# Patient Record
Sex: Male | Born: 1960 | Race: Black or African American | Hispanic: No | Marital: Married | Smoking: Never smoker
Health system: Southern US, Community
[De-identification: ages and names within clinical notes are randomized; demographics above are authoritative.]

## PROBLEM LIST (undated history)

## (undated) DIAGNOSIS — G4730 Sleep apnea, unspecified: Secondary | ICD-10-CM

---

## 2001-12-02 ENCOUNTER — Ambulatory Visit (HOSPITAL_COMMUNITY): Admission: RE | Admit: 2001-12-02 | Discharge: 2001-12-02 | Payer: Self-pay | Admitting: Family Medicine

## 2001-12-02 ENCOUNTER — Encounter: Payer: Self-pay | Admitting: Family Medicine

## 2005-05-15 ENCOUNTER — Ambulatory Visit (HOSPITAL_COMMUNITY): Admission: RE | Admit: 2005-05-15 | Discharge: 2005-05-15 | Payer: Self-pay | Admitting: *Deleted

## 2013-07-17 ENCOUNTER — Emergency Department (HOSPITAL_COMMUNITY)
Admission: EM | Admit: 2013-07-17 | Discharge: 2013-07-17 | Disposition: A | Discharge status: Home / Self Care | Payer: Worker's Compensation | Attending: Emergency Medicine | Admitting: Emergency Medicine

## 2013-07-17 ENCOUNTER — Emergency Department (HOSPITAL_COMMUNITY): Payer: Worker's Compensation

## 2013-07-17 ENCOUNTER — Encounter (HOSPITAL_COMMUNITY): Payer: Self-pay | Admitting: Emergency Medicine

## 2013-07-17 DIAGNOSIS — Y990 Civilian activity done for income or pay: Secondary | ICD-10-CM | POA: Insufficient documentation

## 2013-07-17 DIAGNOSIS — IMO0002 Reserved for concepts with insufficient information to code with codable children: Secondary | ICD-10-CM | POA: Insufficient documentation

## 2013-07-17 DIAGNOSIS — Y9389 Activity, other specified: Secondary | ICD-10-CM | POA: Insufficient documentation

## 2013-07-17 DIAGNOSIS — S68623A Partial traumatic transphalangeal amputation of left middle finger, initial encounter: Secondary | ICD-10-CM

## 2013-07-17 DIAGNOSIS — S68621A Partial traumatic transphalangeal amputation of left index finger, initial encounter: Secondary | ICD-10-CM

## 2013-07-17 DIAGNOSIS — Y9269 Other specified industrial and construction area as the place of occurrence of the external cause: Secondary | ICD-10-CM | POA: Insufficient documentation

## 2013-07-17 DIAGNOSIS — W3189XA Contact with other specified machinery, initial encounter: Secondary | ICD-10-CM | POA: Insufficient documentation

## 2013-07-17 LAB — CBC
HCT: 44.2 % (ref 39.0–52.0)
Hemoglobin: 15.3 g/dL (ref 13.0–17.0)
MCH: 30.4 pg (ref 26.0–34.0)
MCHC: 34.6 g/dL (ref 30.0–36.0)
MCV: 87.9 fL (ref 78.0–100.0)
Platelets: 183 10*3/uL (ref 150–400)
RBC: 5.03 MIL/uL (ref 4.22–5.81)
RDW: 14.6 % (ref 11.5–15.5)
WBC: 6.8 10*3/uL (ref 4.0–10.5)

## 2013-07-17 LAB — BASIC METABOLIC PANEL
BUN: 17 mg/dL (ref 6–23)
CO2: 23 mEq/L (ref 19–32)
Calcium: 9.3 mg/dL (ref 8.4–10.5)
Chloride: 101 mEq/L (ref 96–112)
Creatinine, Ser: 0.88 mg/dL (ref 0.50–1.35)
GFR calc Af Amer: >90 mL/min (ref >90)
GFR calc non Af Amer: >90 mL/min (ref >90)
Glucose, Bld: 101 mg/dL — ABNORMAL HIGH (ref 70–99)
Potassium: 3.6 mEq/L (ref 3.5–5.1)
Sodium: 136 mEq/L (ref 135–145)

## 2013-07-17 MED ORDER — CEFAZOLIN SODIUM 1-5 GM-% IV SOLN: 1.0000 g | Freq: Once | INTRAVENOUS | Status: DC

## 2013-07-17 MED ORDER — HYDROMORPHONE HCL PF 1 MG/ML IJ SOLN
1.0000 mg | Freq: Once | INTRAMUSCULAR | Status: DC
  Filled 2013-07-17: qty 1

## 2013-07-17 MED ORDER — SODIUM CHLORIDE 0.9 % IV SOLN
Freq: Once | INTRAVENOUS | Status: AC
  Administered 2013-07-17: 08:00:00 via INTRAVENOUS

## 2013-07-17 MED ORDER — HYDROMORPHONE HCL PF 1 MG/ML IJ SOLN
1.0000 mg | Freq: Once | INTRAMUSCULAR | Status: AC
  Administered 2013-07-17: 1 mg via INTRAVENOUS
  Filled 2013-07-17: qty 1

## 2013-07-17 MED ORDER — CEFAZOLIN SODIUM 1-5 GM-% IV SOLN
INTRAVENOUS | Status: AC
  Filled 2013-07-17: qty 50

## 2013-07-17 MED ORDER — CEFAZOLIN SODIUM-DEXTROSE 2-3 GM-% IV SOLR
2.0000 g | Freq: Once | INTRAVENOUS | Status: AC
  Administered 2013-07-17: 2 g via INTRAVENOUS
  Filled 2013-07-17: qty 50

## 2013-07-17 NOTE — ED Notes (Signed)
PA at bedside for digital blocks

## 2013-07-17 NOTE — Consult Note (Signed)
Reason for Consult:complex left hand injury Referring Physician: JAIRON RIPBERGER is an 52 y.o. male.  HPI: s/p open injury to left hand with complex open segmental injuries ti index and long fingers  History reviewed. No pertinent past medical history.  History reviewed. No pertinent past surgical history.  No family history on file.  Social History:  reports that he has never smoked. He does not have any smokeless tobacco history on file. His alcohol and drug histories are not on file.  Allergies: No Known Allergies  Medications: Scheduled:  Results for orders placed during the hospital encounter of 07/17/13 (from the past 48 hour(s))  CBC     Status: None   Collection Time    07/17/13  6:40 AM      Result Value Range   WBC 6.8  4.0 - 10.5 K/uL   RBC 5.03  4.22 - 5.81 MIL/uL   Hemoglobin 15.3  13.0 - 17.0 g/dL   HCT 16.1  09.6 - 04.5 %   MCV 87.9  78.0 - 100.0 fL   MCH 30.4  26.0 - 34.0 pg   MCHC 34.6  30.0 - 36.0 g/dL   RDW 40.9  81.1 - 91.4 %   Platelets 183  150 - 400 K/uL  BASIC METABOLIC PANEL     Status: Abnormal   Collection Time    07/17/13  6:40 AM      Result Value Range   Sodium 136  135 - 145 mEq/L   Potassium 3.6  3.5 - 5.1 mEq/L   Chloride 101  96 - 112 mEq/L   CO2 23  19 - 32 mEq/L   Glucose, Bld 101 (*) 70 - 99 mg/dL   BUN 17  6 - 23 mg/dL   Creatinine, Ser 7.82  0.50 - 1.35 mg/dL   Calcium 9.3  8.4 - 95.6 mg/dL   GFR calc non Af Amer >90  >90 mL/min   GFR calc Af Amer >90  >90 mL/min   Comment: (NOTE)     The eGFR has been calculated using the CKD EPI equation.     This calculation has not been validated in all clinical situations.     eGFR's persistently <90 mL/min signify possible Chronic Kidney     Disease.    Dg Hand Complete Left  07/17/2013   CLINICAL DATA:  Roller amputation, injury.  EXAM: LEFT HAND - COMPLETE 3+ VIEW  COMPARISON:  None available for comparison at time of study interpretation.  FINDINGS: Comminuted 3rd distal  phalanx and distal aspect of the 3rd middle phalanx fractures. The bony fragment appears displaced medially. Associated soft tissue irregularity most consistent with laceration/open fracture. Tiny bony fragment at the 4th tuft, no discrete donor site. Soft tissue irregularity of the 2nd distal phalanx without fracture. No radiopaque foreign bodies. No dislocation. No destructive bony lesions.  IMPRESSION: Comminuted fracture of 3rd digit at the distal middle phalanx. A tiny bony fragment projecting at 4th tuft. Second digit is soft tissue irregularity without fracture deformity.   Electronically Signed   By: Awilda Metro   On: 07/17/2013 07:04    Review of Systems  All other systems reviewed and are negative.   Blood pressure 157/93, pulse 81, temperature 98.7 F (37.1 C), resp. rate 24, SpO2 100.00%. Physical Exam  Constitutional: He is oriented to person, place, and time. He appears well-developed and well-nourished.  HENT:  Head: Normocephalic and atraumatic.  Cardiovascular: Normal rate.   Respiratory: Effort normal.  Musculoskeletal:       Left hand: He exhibits bony tenderness, deformity and laceration.  Complex open injuries to left index and long fingers distally  Neurological: He is alert and oriented to person, place, and time.  Skin: Skin is warm.  Psychiatric: He has a normal mood and affect. His behavior is normal. Judgment and thought content normal.    Assessment/Plan: As above  Plan  revision amputation of above as outpatient  Lonnie Reth A 07/17/2013, 8:23 AM

## 2013-07-17 NOTE — ED Provider Notes (Signed)
CSN: [id number]     Arrival date & time 07/17/13  0612 History   First MD Initiated Contact with Patient 07/17/13 [phone]     Chief Complaint  Patient presents with  . Finger Injury   (Consider location/radiation/quality/duration/timing/severity/associated sxs/prior Treatment) The history is provided by the patient and medical records.   This is a 52 y.o. M with no significant PMH presenting to the ED for left hand injury.  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  States hand got caught in the chain and attempted to pull it out suffering obvious injury to his left 2nd-4th digits.  Pt states no paint got into wound.  Tetanus UTD.  Pt is right hand dominant.  Last PO intake 0530.  History reviewed. No pertinent past medical history. History reviewed. No pertinent past surgical history. No family history on file. History  Substance Use Topics  . Smoking status: Never Smoker   . Smokeless tobacco: Not on file  . Alcohol Use: Not on file    Review of Systems  Skin: Positive for wound.  All other systems reviewed and are negative.    Allergies  Review of patient's allergies indicates no known allergies.  Home Medications  No current outpatient prescriptions on file. BP 157/93  Pulse 81  Temp(Src) 98.7 F (37.1 C)  Resp 24  SpO2 100%  Physical Exam  Nursing note and vitals reviewed. Constitutional: He is oriented to person, place, and time. He appears well-developed and well-nourished. No distress.  HENT:  Head: Normocephalic and atraumatic.  Mouth/Throat: Oropharynx is clear and moist.  Eyes: Conjunctivae and EOM are normal. Pupils are equal, round, and reactive to light.  Neck: Normal range of motion. Neck supple.  Cardiovascular: Normal rate, regular rhythm and normal heart sounds.   Pulmonary/Chest: Effort normal and breath sounds normal. No respiratory distress. He has no wheezes.    Musculoskeletal: Normal range of motion.       Hands: Almost complete amputation of distal left index and middle fingers with only remaining attachment by palmar skin; small laceration to left dorsal ring finger at DIP joint; no thumb or 5th digit involvement; full sensation of left thumb, ring, and little finger; sensation up to point of amputation on index and middle finger  Neurological: He is alert and oriented to person, place, and time.  Skin: Skin is warm and dry. He is not diaphoretic.  Psychiatric: He has a normal mood and affect.      ED Course  NERVE BLOCK Date/Time: 07/17/2013 6:48 AM Performed by: Garlon Hatchet Authorized by: Garlon Hatchet Consent: Verbal consent obtained. Consent given by: patient Patient understanding: patient states understanding of the procedure being performed Patient identity confirmed: verbally with patient Indications: pain relief, extensive wound and fracture Body area: upper extremity Nerve: digital Laterality: left Patient sedated: no Preparation: Patient was prepped and draped in the usual sterile fashion. Needle gauge: 25 G Local anesthetic: lidocaine 1% without epinephrine Anesthetic total: 20 ml Outcome: pain improved Patient tolerance: Patient tolerated the procedure well with no immediate complications. Comments: Left 2nd, 3rd, and 4th digits blocked.  Pt had immediate pain relief.  Tolerated procedure well without noted complications.   (including critical care time) Labs Review Labs Reviewed - No data to display Imaging Review Dg Hand Complete Left  07/17/2013   CLINICAL DATA:  Roller amputation, injury.  EXAM: LEFT HAND - COMPLETE 3+ VIEW  COMPARISON:  None available for comparison at time of study interpretation.  FINDINGS: Comminuted 3rd distal phalanx and distal aspect of the 3rd middle phalanx fractures. The bony fragment appears displaced medially. Associated soft tissue irregularity most consistent with laceration/open  fracture. Tiny bony fragment at the 4th tuft, no discrete donor site. Soft tissue irregularity of the 2nd distal phalanx without fracture. No radiopaque foreign bodies. No dislocation. No destructive bony lesions.  IMPRESSION: Comminuted fracture of 3rd digit at the distal middle phalanx. A tiny bony fragment projecting at 4th tuft. Second digit is soft tissue irregularity without fracture deformity.   Electronically Signed   By: Awilda Metro   On: 07/17/2013 07:04    EKG Interpretation   None       MDM   1. 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 Partial traumatic transphalangeal amputation of left index finger, initial encounter    Pt with traumatic, almost complete amputation of distal left middle and index fingers.  Sensation intact up to point of amputation of involved fingers, all other fingers with full sensation.  2g Ancef given.  Tetanus UTD.  Digital block performed as above with good improvement of pain.  Consulted hand surgery, Dr. Mina Marble who has evaluated pt in the ED and scheduled for surgical repair at the surgery center later today.  Garlon Hatchet, PA-C 07/17/13 [phone]

## 2013-07-17 NOTE — ED Provider Notes (Signed)
Medical screening examination/treatment/procedure(s) were conducted as a shared visit with non-physician practitioner(s) and myself.  I personally evaluated the patient during the encounter.  EKG Interpretation   None       Patient with complex hand injury which will require operative repair.  Case discussed with orthopedic hand surgery.  Antibiotics in ER.  Patient will be taken and operated on later today at the outpatient surgical Center.  Patient's family updated.  Lyanne Co, MD 07/17/13 [phone]

## 2013-07-17 NOTE — ED Notes (Signed)
[id number] left message for wife at pt request

## 2013-07-17 NOTE — ED Notes (Addendum)
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 Index finger tip a complete amputation, middle finger amputation above first knuckle attached by small area of skin. Laceration noted to 4th finger.

## 2013-07-17 NOTE — ED Notes (Signed)
Pt at work; got left hand caught in a "track"; pulled hand into chain; presents with obvious open fracture left middle finger; glove still in place; unsure of other finger injuries

## 2013-07-17 NOTE — ED Notes (Signed)
Spoke with pts wife, she is enroute to ED

## 2017-09-06 ENCOUNTER — Other Ambulatory Visit: Payer: Self-pay | Admitting: Family Medicine

## 2017-09-06 ENCOUNTER — Other Ambulatory Visit (HOSPITAL_COMMUNITY): Payer: Self-pay

## 2017-09-06 ENCOUNTER — Ambulatory Visit
Admission: RE | Admit: 2017-09-06 | Discharge: 2017-09-06 | Disposition: A | Discharge status: Home / Self Care | Payer: 59 | Source: Ambulatory Visit | Attending: Family Medicine | Admitting: Family Medicine

## 2017-09-06 DIAGNOSIS — R52 Pain, unspecified: Secondary | ICD-10-CM

## 2017-09-24 ENCOUNTER — Other Ambulatory Visit: Payer: Self-pay | Admitting: Family Medicine

## 2017-09-24 ENCOUNTER — Ambulatory Visit
Admission: RE | Admit: 2017-09-24 | Discharge: 2017-09-24 | Disposition: A | Discharge status: Home / Self Care | Payer: 59 | Source: Ambulatory Visit | Attending: Family Medicine | Admitting: Family Medicine

## 2017-09-24 DIAGNOSIS — R918 Other nonspecific abnormal finding of lung field: Secondary | ICD-10-CM

## 2018-04-22 ENCOUNTER — Encounter: Payer: Self-pay | Admitting: Podiatry

## 2018-04-22 ENCOUNTER — Ambulatory Visit (INDEPENDENT_AMBULATORY_CARE_PROVIDER_SITE_OTHER): Payer: 59

## 2018-04-22 ENCOUNTER — Other Ambulatory Visit: Payer: Self-pay | Admitting: Podiatry

## 2018-04-22 ENCOUNTER — Ambulatory Visit: Payer: 59 | Admitting: Podiatry

## 2018-04-22 ENCOUNTER — Other Ambulatory Visit: Payer: Self-pay

## 2018-04-22 VITALS — BP 137/86 | HR 60

## 2018-04-22 DIAGNOSIS — M2042 Other hammer toe(s) (acquired), left foot: Secondary | ICD-10-CM | POA: Diagnosis not present

## 2018-04-22 DIAGNOSIS — M2041 Other hammer toe(s) (acquired), right foot: Secondary | ICD-10-CM | POA: Diagnosis not present

## 2018-04-22 DIAGNOSIS — M79675 Pain in left toe(s): Secondary | ICD-10-CM

## 2018-04-22 DIAGNOSIS — L600 Ingrowing nail: Secondary | ICD-10-CM

## 2018-04-22 NOTE — Patient Instructions (Signed)

## 2018-04-23 NOTE — Progress Notes (Signed)
Subjective:   Patient ID: Brian Bennett, male   DOB: 57 y.o.   MRN: [id number]   HPI Patient presents stating he is having a lot of pain in his toenail and is making it hard to walk and its been going on for at least 2 months.  He has to wear steel toe shoes at work which makes it worse and is trying to trim it out soak it without relief of his symptoms.  Patient does not smoke and likes to be active   Review of Systems  All other systems reviewed and are negative.       Objective:  Physical Exam  Constitutional: He appears well-developed and well-nourished.  Cardiovascular: Intact distal pulses.  Pulmonary/Chest: Effort normal.  Musculoskeletal: Normal range of motion.  Neurological: He is alert.  Skin: Skin is warm.  Nursing note and vitals reviewed.   Neurovascular status intact muscle strength is adequate range of motion within normal limits with patient noted to have a thickened incurvated left second nail medial side that is painful when pressed and making shoe gear difficult.  There is also moderate structural hammertoe deformity bilateral and this is contributory to his problems as there is a lot of pressure between the second toe and big toe.  Patient has good digital perfusion and is well oriented x3     Assessment:  Ingrown toenail deformity left second toe medial side that is painful when palpated and making shoe gear difficult with structural hammertoe deformity also noted     Plan:  H&P reviewed both conditions and at this point I recommended taking care of the toenail with the hope this will solve all his problems and he will not require any form of digital correction.  I went ahead today and I explained procedure and risk and patient signed consent form and I infiltrated the left second toe 60 mg like Marcaine mixture sterile prep applied to the toe and using sterile instrumentation I remove the medial border exposed matrix and applied phenol 3 applications 30  seconds followed by alcohol lavage and sterile dressing.  Gave instructions on soaks and reappoint and also encouraged to call with any questions concerns

## 2018-07-10 IMAGING — CR DG CHEST 2V
2 series · 2 of 2 positions shown · non-contrast
Comparison: None.

CLINICAL DATA: Cough

EXAM:
CHEST  2 VIEW

[w chest pa]
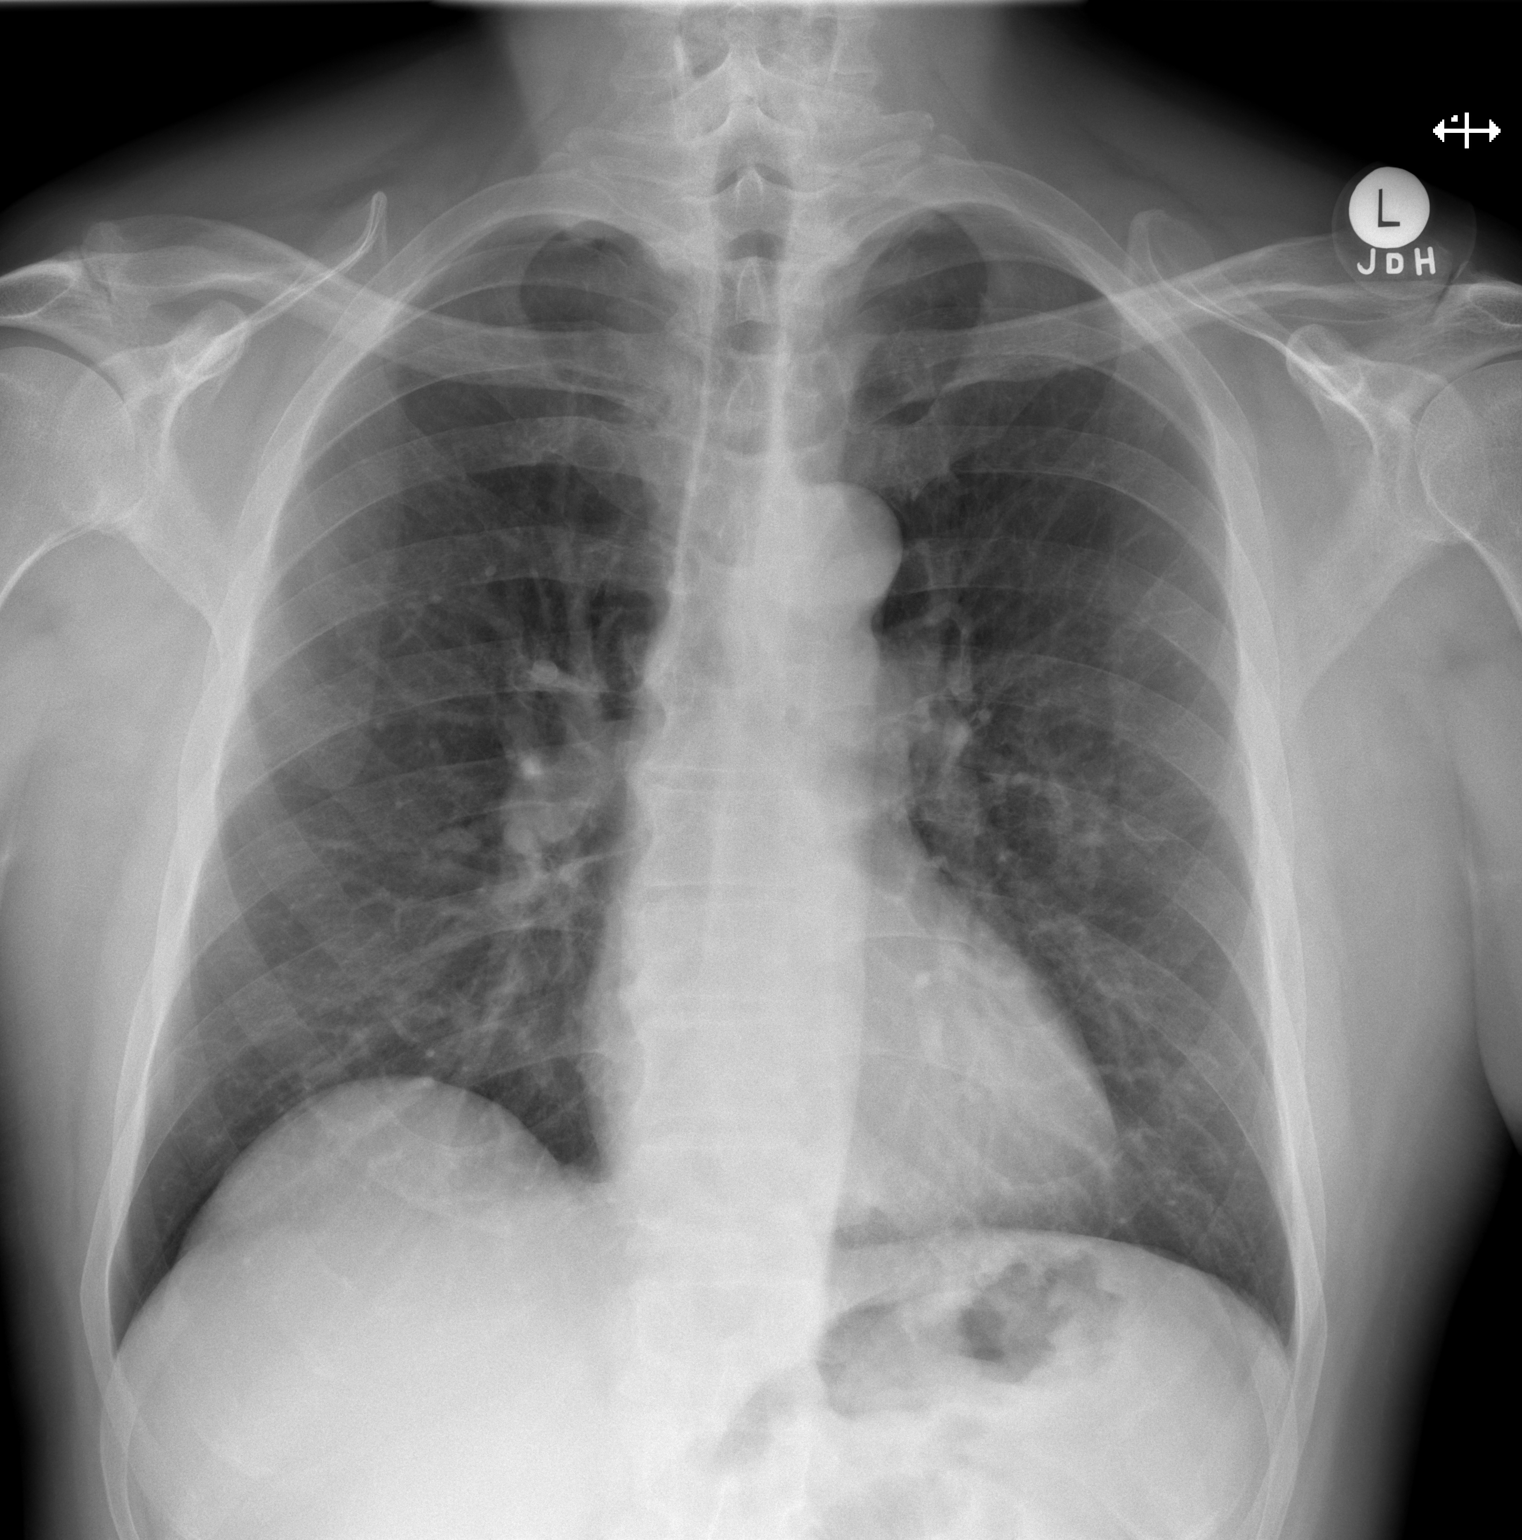

[w chest lat]
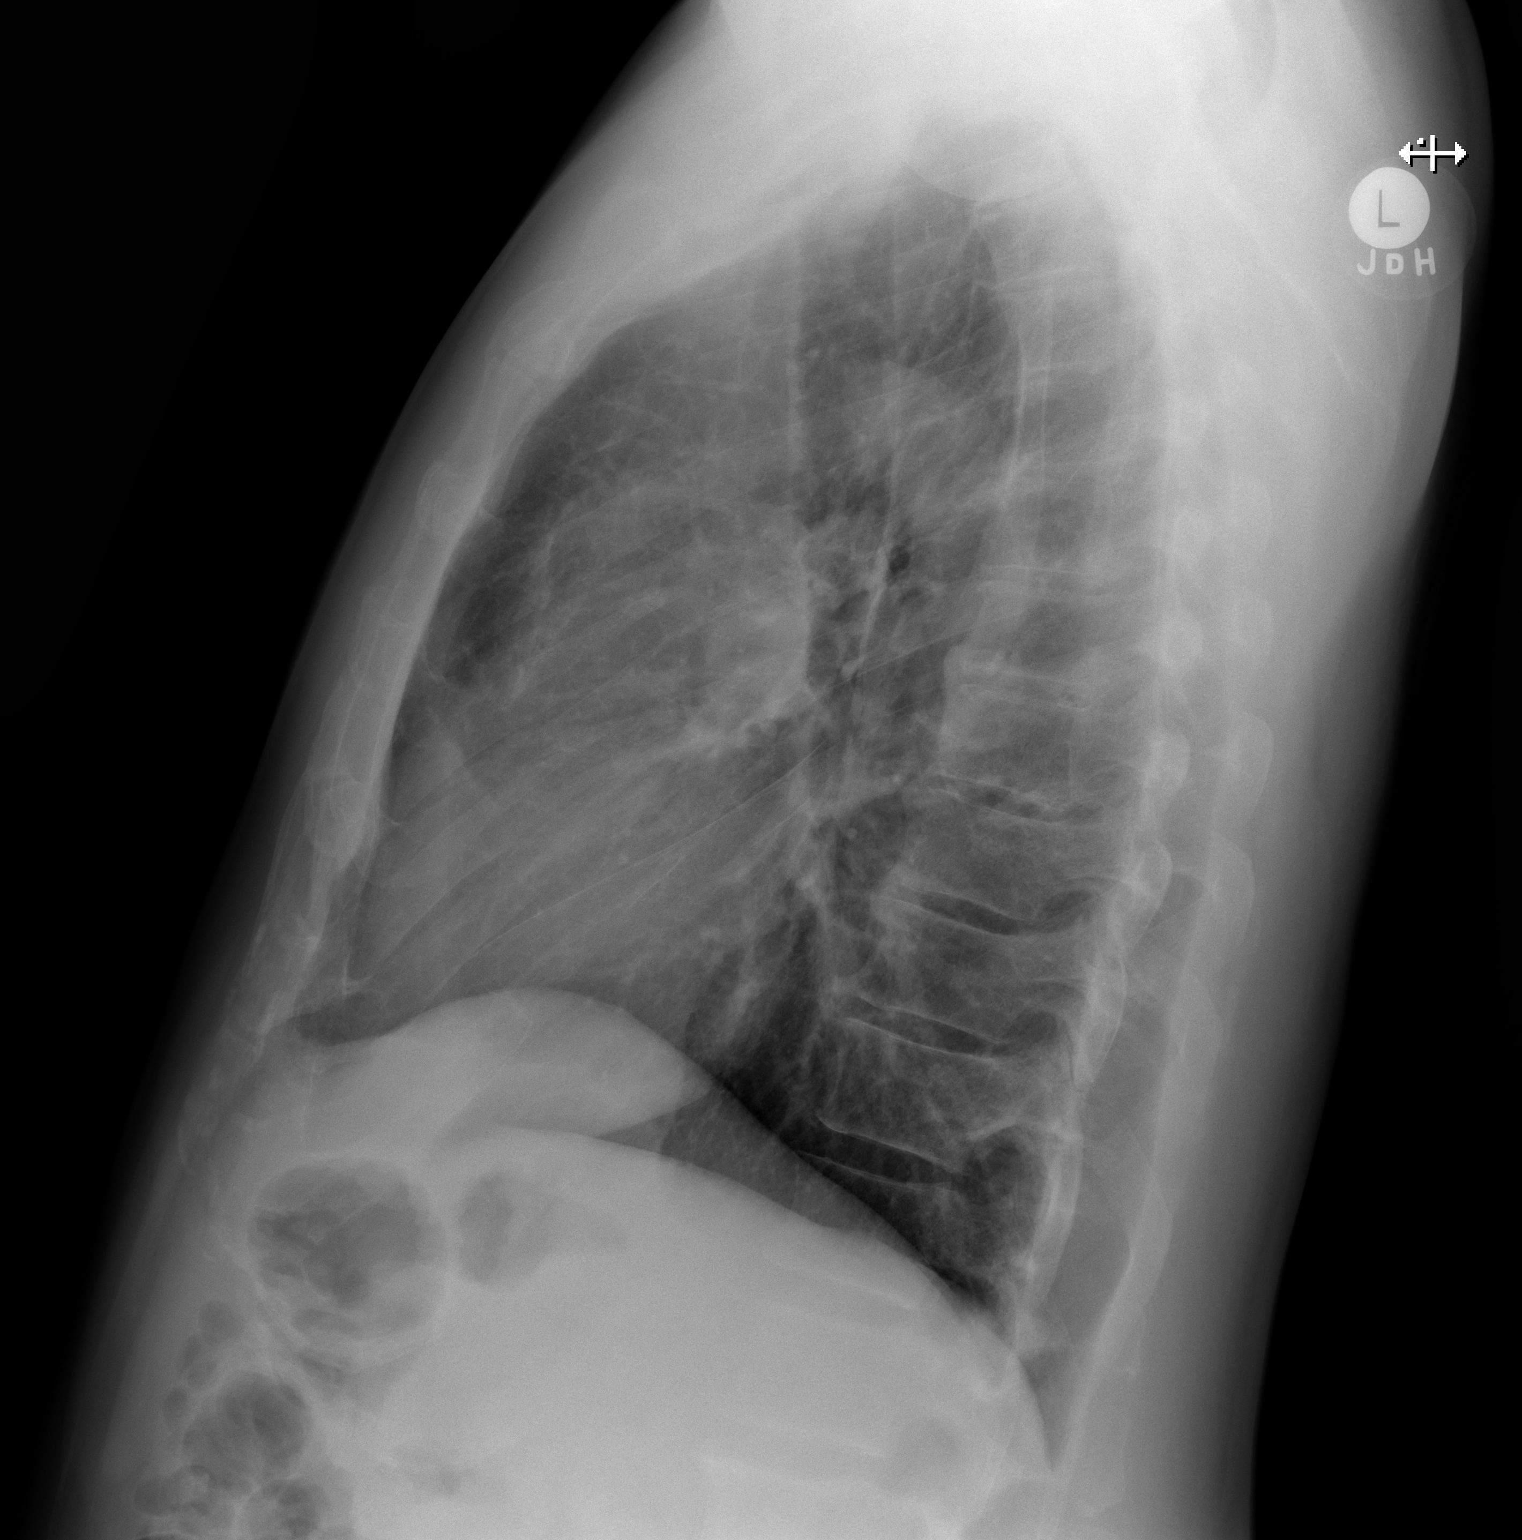

[2 of 2 positions shown; findings below may reference images not displayed]

FINDINGS: Cardiac shadow is within normal limits. The lungs are well aerated
bilaterally. No focal infiltrate or sizable effusion is seen. No
bony abnormality is noted.
IMPRESSION: No active cardiopulmonary disease.

## 2019-07-19 ENCOUNTER — Other Ambulatory Visit: Payer: Self-pay

## 2019-07-19 DIAGNOSIS — Z20822 Contact with and (suspected) exposure to covid-19: Secondary | ICD-10-CM

## 2019-07-20 LAB — NOVEL CORONAVIRUS, NAA: SARS-CoV-2, NAA: NOT DETECTED

## 2019-09-29 DIAGNOSIS — F17210 Nicotine dependence, cigarettes, uncomplicated: Secondary | ICD-10-CM | POA: Diagnosis not present

## 2019-09-29 DIAGNOSIS — I10 Essential (primary) hypertension: Secondary | ICD-10-CM | POA: Diagnosis not present

## 2019-09-29 DIAGNOSIS — R7303 Prediabetes: Secondary | ICD-10-CM | POA: Diagnosis not present

## 2020-01-18 DIAGNOSIS — R1905 Periumbilic swelling, mass or lump: Secondary | ICD-10-CM | POA: Diagnosis not present

## 2020-01-18 DIAGNOSIS — R030 Elevated blood-pressure reading, without diagnosis of hypertension: Secondary | ICD-10-CM | POA: Diagnosis not present

## 2020-01-23 ENCOUNTER — Other Ambulatory Visit: Payer: Self-pay | Admitting: Physician Assistant

## 2020-01-23 DIAGNOSIS — R1905 Periumbilic swelling, mass or lump: Secondary | ICD-10-CM

## 2020-02-01 ENCOUNTER — Ambulatory Visit
Admission: RE | Admit: 2020-02-01 | Discharge: 2020-02-01 | Disposition: A | Discharge status: Home / Self Care | Payer: 59 | Source: Ambulatory Visit | Attending: Physician Assistant | Admitting: Physician Assistant

## 2020-02-01 DIAGNOSIS — K429 Umbilical hernia without obstruction or gangrene: Secondary | ICD-10-CM | POA: Diagnosis not present

## 2020-02-01 DIAGNOSIS — R1905 Periumbilic swelling, mass or lump: Secondary | ICD-10-CM

## 2020-02-16 DIAGNOSIS — K429 Umbilical hernia without obstruction or gangrene: Secondary | ICD-10-CM | POA: Diagnosis not present

## 2020-03-29 ENCOUNTER — Ambulatory Visit: Payer: Self-pay | Admitting: Surgery

## 2020-03-29 DIAGNOSIS — K429 Umbilical hernia without obstruction or gangrene: Secondary | ICD-10-CM | POA: Diagnosis not present

## 2020-03-29 NOTE — H&P (Signed)
History of Present Illness Brian Bennett. Brian Thedford MD; 03/29/2020 9:58 AM) The patient is a 59 year old male who presents with an umbilical hernia. Referred by Brian Amble PA-C for umbilical hernia  This is a 59 year old male in good health who presents with a one-month history of a palpable mass just above his umbilicus. It remains reducible. He denies any obstructive symptoms. The PA vent evaluated him and ordered an ultrasound that confirmed a fat-containing umbilical hernia. He presents now to discuss repair.  The patient works at [employer] for [employer]. His job does require a lot of heavy lifting. He also smokes a half a pack a day.   Problem List/Past Medical Brian Bennett K. Avary Pitsenbarger, MD; 03/29/2020 9:58 AM) UMBILICAL HERNIA WITHOUT OBSTRUCTION OR GANGRENE (K42.9)  Past Surgical History Brian Bickers, LPN; 0/93/2355 7:32 AM) Foot Surgery Right.  Allergies Brian Bickers, LPN; 09/11/5425 0:62 AM) No Known Drug Allergies [03/29/2020]: Allergies Reconciled  Medication History Brian Bickers, LPN; 3/76/2831 5:17 AM) Valsartan-hydroCHLOROthiazide (320-12.5MG  Tablet, Oral) Active. Multivitamin (Oral) Active. Medications Reconciled  Social History Brian Bickers, LPN; 01/24/736 1:06 AM) Alcohol use Moderate alcohol use. Illicit drug use Remotely quit drug use. Tobacco use Current every day smoker.  Family History Brian Bickers, LPN; 2/69/4854 6:27 AM) Arthritis Family Members In General. Colon Cancer Family Members In General.  Other Problems Brian Bennett. Brian Sider, MD; 03/29/2020 9:58 AM) Inguinal Hernia     Review of Systems Brian Bennett [facility] LPN; 0/35/0093 8:18 AM) General Not Present- Appetite Loss, Chills, Fatigue, Fever, Night Sweats, Weight Gain and Weight Loss. HEENT Not Present- Earache, Hearing Loss, Hoarseness, Nose Bleed, Oral Ulcers, Ringing in the Ears, Seasonal Allergies, Sinus Pain, Sore Throat, Visual Disturbances, Wears glasses/contact lenses and Yellow  Eyes. Respiratory Present- Snoring. Not Present- Bloody sputum, Chronic Cough, Difficulty Breathing and Wheezing. Breast Not Present- Breast Mass, Breast Pain, Nipple Discharge and Skin Changes. Cardiovascular Not Present- Chest Pain, Difficulty Breathing Lying Down, Leg Cramps, Palpitations, Rapid Heart Rate, Shortness of Breath and Swelling of Extremities. Gastrointestinal Not Present- Abdominal Pain, Bloating, Bloody Stool, Change in Bowel Habits, Chronic diarrhea, Constipation, Difficulty Swallowing, Excessive gas, Gets full quickly at meals, Hemorrhoids, Indigestion, Nausea, Rectal Pain and Vomiting. Male Genitourinary Not Present- Blood in Urine, Change in Urinary Stream, Frequency, Impotence, Nocturia, Painful Urination, Urgency and Urine Leakage. Musculoskeletal Not Present- Back Pain, Joint Pain, Joint Stiffness, Muscle Pain, Muscle Weakness and Swelling of Extremities. Neurological Not Present- Decreased Memory, Fainting, Headaches, Numbness, Seizures, Tingling, Tremor, Trouble walking and Weakness. Psychiatric Not Present- Anxiety, Bipolar, Change in Sleep Pattern, Depression, Fearful and Frequent crying. Endocrine Not Present- Cold Intolerance, Excessive Hunger, Hair Changes, Heat Intolerance, Hot flashes and New Diabetes.  Vitals Brian Bennett Dockery LPN; 2/99/3716 9:67 AM) 03/29/2020 9:40 AM Weight: 222.2 lb Height: 77in Body Surface Area: 2.34 m Body Mass Index: 26.35 kg/m  Temp.: 97.66F(Thermal Scan)  Pulse: 79 (Regular)  BP: 138/86(Sitting, Left Arm, Standard)        Physical Exam Brian Bennett K. Carmello Cabiness MD; 03/29/2020 9:59 AM)  The physical exam findings are as follows: Note:Constitutional: WDWN in NAD, conversant, no obvious deformities; resting comfortably Eyes: Pupils equal, round; sclera anicteric; moist conjunctiva; no lid lag HENT: Oral mucosa moist; good dentition Neck: No masses palpated, trachea midline; no thyromegaly Lungs: CTA bilaterally; normal  respiratory effort CV: Regular rate and rhythm; no murmurs; extremities well-perfused with no edema Abd: +bowel sounds, soft, non-tender, no palpable organomegaly; small palpable reducible hernia just above and to the right of the umbilicus. No skin changes. This enlarges with  Valsalva maneuver. Musc: Normal gait; no apparent clubbing or cyanosis in extremities Lymphatic: No palpable cervical or axillary lymphadenopathy Skin: Warm, dry; no sign of jaundice Psychiatric - alert and oriented x 4; calm mood and affect    Assessment & Plan Brian Bennett K. Jasper Hanf MD; 03/29/2020 9:56 AM)  UMBILICAL HERNIA WITHOUT OBSTRUCTION OR GANGRENE (K42.9)  Current Plans Schedule for Surgery - Umbilical hernia repair with mesh. The surgical procedure has been discussed with the patient. Potential risks, benefits, alternative treatments, and expected outcomes have been explained. All of the patient's questions at this time have been answered. The likelihood of reaching the patient's treatment goal is good. The patient understand the proposed surgical procedure and wishes to proceed.  Brian Bennett. Corliss Skains, MD, [facility]  General/ Trauma Surgery   03/29/2020 9:59 AM

## 2020-04-30 DIAGNOSIS — K429 Umbilical hernia without obstruction or gangrene: Secondary | ICD-10-CM | POA: Diagnosis not present

## 2020-05-07 DIAGNOSIS — R7303 Prediabetes: Secondary | ICD-10-CM | POA: Diagnosis not present

## 2020-05-07 DIAGNOSIS — Z125 Encounter for screening for malignant neoplasm of prostate: Secondary | ICD-10-CM | POA: Diagnosis not present

## 2020-05-07 DIAGNOSIS — I10 Essential (primary) hypertension: Secondary | ICD-10-CM | POA: Diagnosis not present

## 2020-05-07 DIAGNOSIS — Z13220 Encounter for screening for lipoid disorders: Secondary | ICD-10-CM | POA: Diagnosis not present

## 2020-05-07 DIAGNOSIS — F17200 Nicotine dependence, unspecified, uncomplicated: Secondary | ICD-10-CM | POA: Diagnosis not present

## 2020-05-07 DIAGNOSIS — Z0000 Encounter for general adult medical examination without abnormal findings: Secondary | ICD-10-CM | POA: Diagnosis not present

## 2020-06-03 DIAGNOSIS — R059 Cough, unspecified: Secondary | ICD-10-CM | POA: Diagnosis not present

## 2020-06-03 DIAGNOSIS — Z20828 Contact with and (suspected) exposure to other viral communicable diseases: Secondary | ICD-10-CM | POA: Diagnosis not present

## 2020-06-04 DIAGNOSIS — H6692 Otitis media, unspecified, left ear: Secondary | ICD-10-CM | POA: Diagnosis not present

## 2020-06-04 DIAGNOSIS — B349 Viral infection, unspecified: Secondary | ICD-10-CM | POA: Diagnosis not present

## 2020-06-04 DIAGNOSIS — J029 Acute pharyngitis, unspecified: Secondary | ICD-10-CM | POA: Diagnosis not present

## 2020-06-24 ENCOUNTER — Other Ambulatory Visit: Payer: BC Managed Care – PPO

## 2020-06-24 DIAGNOSIS — Z20822 Contact with and (suspected) exposure to covid-19: Secondary | ICD-10-CM | POA: Diagnosis not present

## 2020-06-25 LAB — NOVEL CORONAVIRUS, NAA: SARS-CoV-2, NAA: DETECTED — AB

## 2020-06-25 LAB — SARS-COV-2, NAA 2 DAY TAT

## 2020-06-26 ENCOUNTER — Telehealth: Payer: Self-pay | Admitting: Nurse Practitioner

## 2020-06-26 NOTE — Telephone Encounter (Signed)
Called to Discuss with patient about Covid symptoms and the use of the monoclonal antibody infusion for those with mild to moderate Covid symptoms and at a high risk of hospitalization.     Pt appears to qualify for this infusion due to co-morbid conditions and/or a member of an at-risk group in accordance with the FDA Emergency Use Authorization. (BMI >25).   After discussing with patient he reports symptoms started >10 days prior with cold/flu-like which no has resolved. Not eligible for infusion. Does have a lingering cough.   Willette Alma, NP [facility]  [phone]

## 2020-11-06 DIAGNOSIS — F17200 Nicotine dependence, unspecified, uncomplicated: Secondary | ICD-10-CM | POA: Diagnosis not present

## 2020-11-06 DIAGNOSIS — R7303 Prediabetes: Secondary | ICD-10-CM | POA: Diagnosis not present

## 2020-11-06 DIAGNOSIS — I10 Essential (primary) hypertension: Secondary | ICD-10-CM | POA: Diagnosis not present

## 2020-11-16 IMAGING — US US ABDOMEN LIMITED
1 series · 10 of 10 positions shown · non-contrast
Comparison: CT of the abdomen and pelvis on 12/20/2009

CLINICAL DATA: Paraumbilical mass for 1 week.

EXAM:
ULTRASOUND ABDOMEN LIMITED

[Series 1: us abdomen limited · 0.06mm/px · 10 acquisitions, 10 frames shown]
[im 1/10]
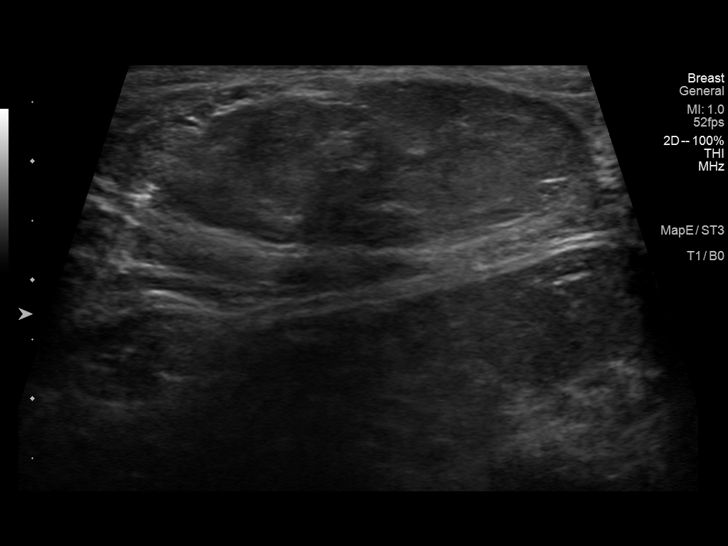
[im 2/10]
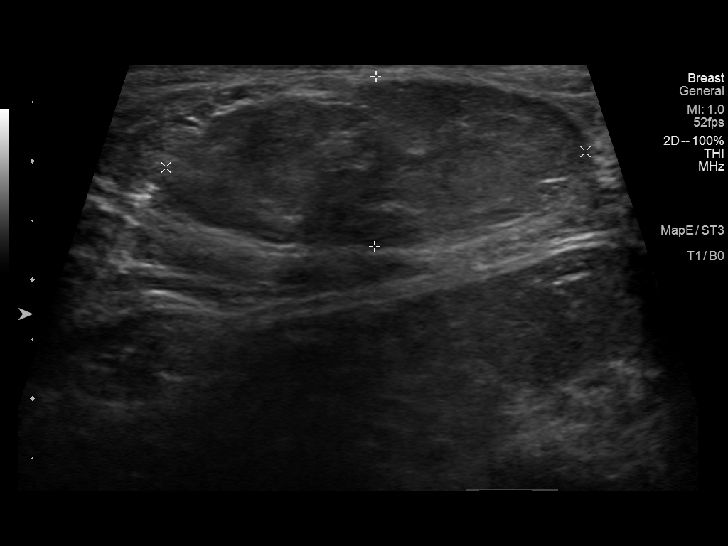
[im 3/10]
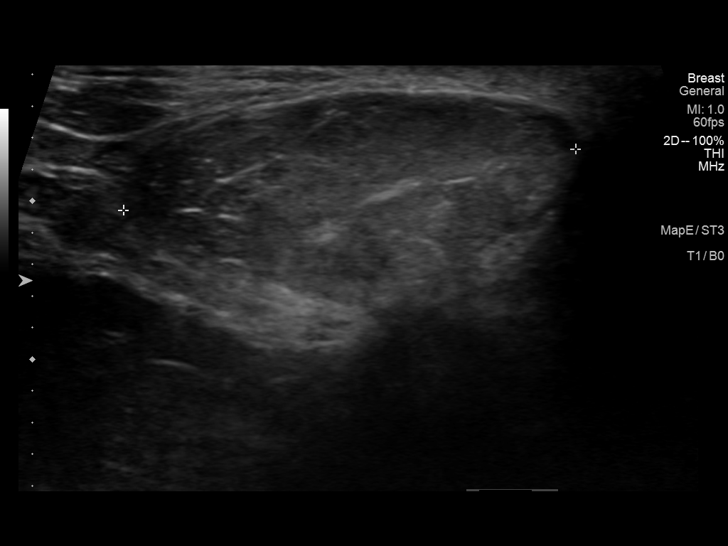
[im 4/10]
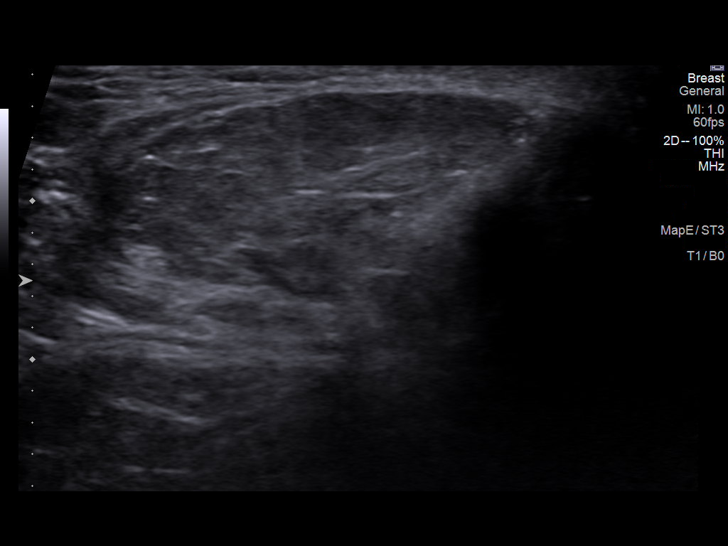
[im 5/10]
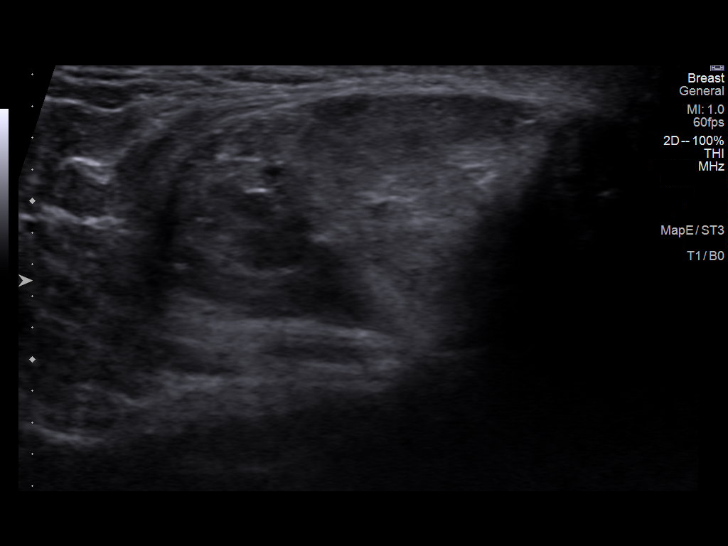
[im 6/10]
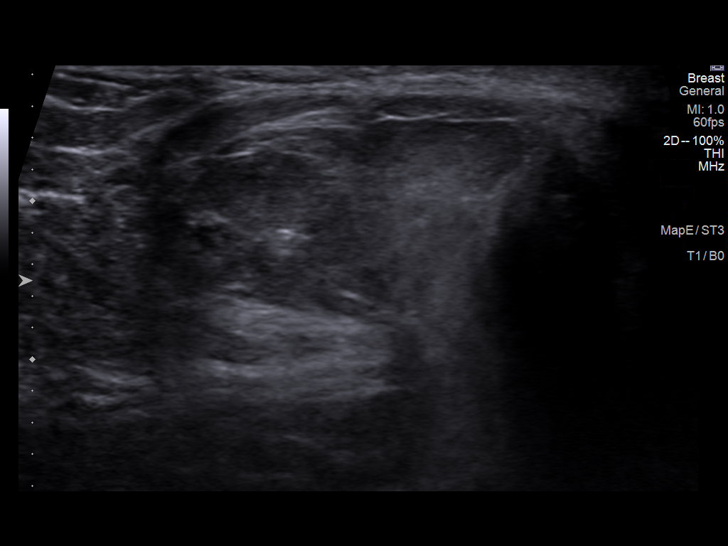
[im 7/10]
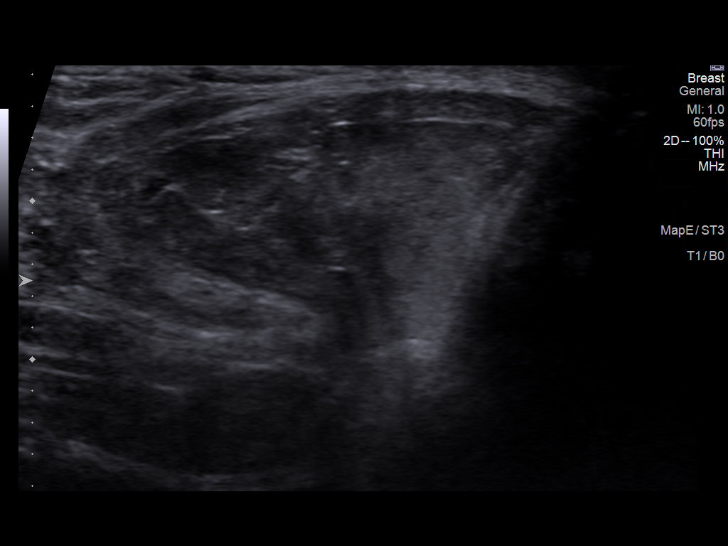
[im 8/10]
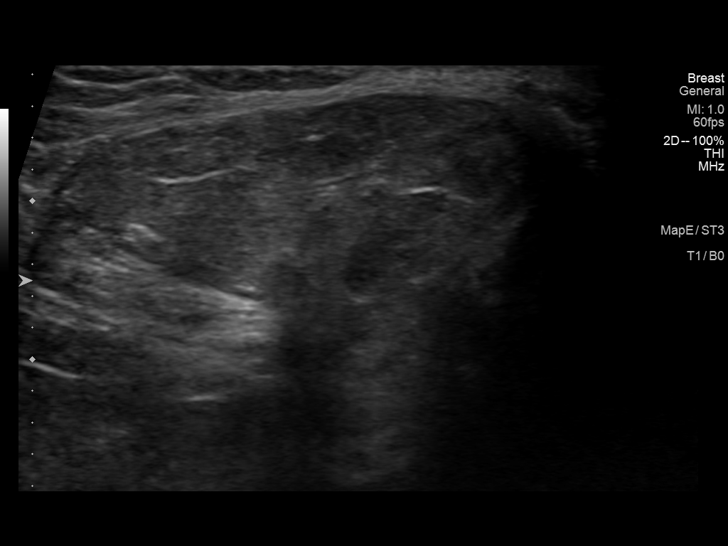
[im 9/10]
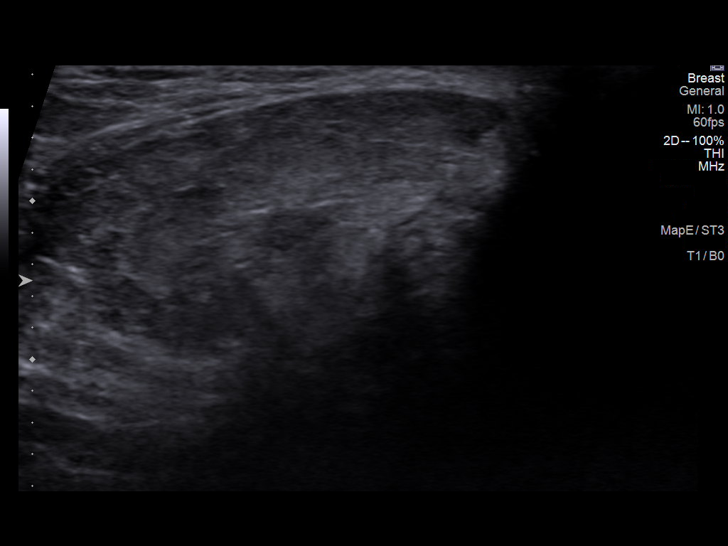
[im 10/10]
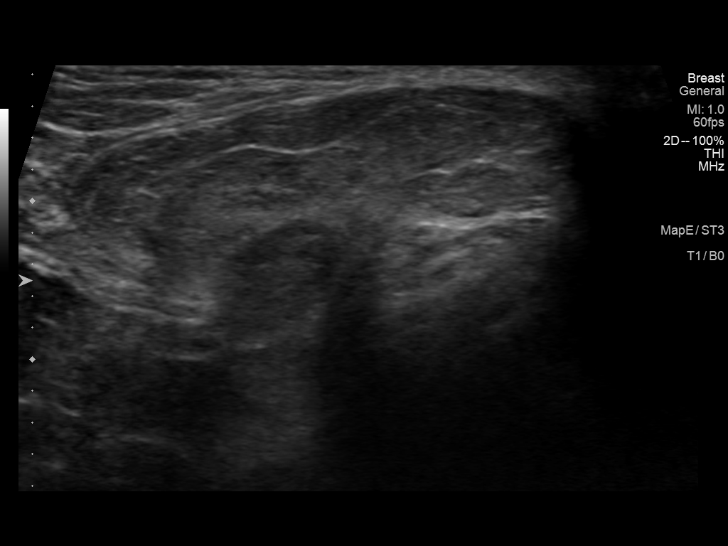

[10 of 10 positions shown; findings below may reference images not displayed]

FINDINGS: Targeted ultrasound is performed in the area of patient's concern
showing a circumscribed oval isoechoic mass measuring 1.4 x 3.5 x
2.9 centimeters. The appearance of the mass is dynamic during
imaging, consistent with herniating fat. No evidence for bowel
herniation. No fluid collection or dilated bowel loops.
IMPRESSION: Fat containing paraumbilical hernia.

## 2021-05-09 DIAGNOSIS — Z0000 Encounter for general adult medical examination without abnormal findings: Secondary | ICD-10-CM | POA: Diagnosis not present

## 2021-05-09 DIAGNOSIS — F17200 Nicotine dependence, unspecified, uncomplicated: Secondary | ICD-10-CM | POA: Diagnosis not present

## 2021-05-09 DIAGNOSIS — Z13220 Encounter for screening for lipoid disorders: Secondary | ICD-10-CM | POA: Diagnosis not present

## 2021-05-09 DIAGNOSIS — R7303 Prediabetes: Secondary | ICD-10-CM | POA: Diagnosis not present

## 2021-05-09 DIAGNOSIS — R252 Cramp and spasm: Secondary | ICD-10-CM | POA: Diagnosis not present

## 2021-05-09 DIAGNOSIS — I10 Essential (primary) hypertension: Secondary | ICD-10-CM | POA: Diagnosis not present

## 2021-11-28 DIAGNOSIS — R7303 Prediabetes: Secondary | ICD-10-CM | POA: Diagnosis not present

## 2021-11-28 DIAGNOSIS — I10 Essential (primary) hypertension: Secondary | ICD-10-CM | POA: Diagnosis not present

## 2021-11-28 DIAGNOSIS — F17200 Nicotine dependence, unspecified, uncomplicated: Secondary | ICD-10-CM | POA: Diagnosis not present

## 2022-05-22 DIAGNOSIS — Z13220 Encounter for screening for lipoid disorders: Secondary | ICD-10-CM | POA: Diagnosis not present

## 2022-05-22 DIAGNOSIS — I10 Essential (primary) hypertension: Secondary | ICD-10-CM | POA: Diagnosis not present

## 2022-05-22 DIAGNOSIS — R7303 Prediabetes: Secondary | ICD-10-CM | POA: Diagnosis not present

## 2022-05-22 DIAGNOSIS — Z0000 Encounter for general adult medical examination without abnormal findings: Secondary | ICD-10-CM | POA: Diagnosis not present

## 2022-05-22 DIAGNOSIS — F17210 Nicotine dependence, cigarettes, uncomplicated: Secondary | ICD-10-CM | POA: Diagnosis not present

## 2022-05-22 DIAGNOSIS — Z125 Encounter for screening for malignant neoplasm of prostate: Secondary | ICD-10-CM | POA: Diagnosis not present

## 2022-11-23 ENCOUNTER — Other Ambulatory Visit: Payer: Self-pay | Admitting: Physician Assistant

## 2022-11-23 DIAGNOSIS — F17200 Nicotine dependence, unspecified, uncomplicated: Secondary | ICD-10-CM

## 2022-11-23 DIAGNOSIS — R7303 Prediabetes: Secondary | ICD-10-CM | POA: Diagnosis not present

## 2022-11-23 DIAGNOSIS — F17210 Nicotine dependence, cigarettes, uncomplicated: Secondary | ICD-10-CM | POA: Diagnosis not present

## 2022-11-23 DIAGNOSIS — I10 Essential (primary) hypertension: Secondary | ICD-10-CM | POA: Diagnosis not present

## 2022-11-23 DIAGNOSIS — Z23 Encounter for immunization: Secondary | ICD-10-CM | POA: Diagnosis not present

## 2023-01-01 ENCOUNTER — Encounter: Payer: Self-pay | Admitting: Physician Assistant

## 2023-01-11 ENCOUNTER — Ambulatory Visit
Admission: RE | Admit: 2023-01-11 | Discharge: 2023-01-11 | Disposition: A | Discharge status: Home / Self Care | Payer: BC Managed Care – PPO | Source: Ambulatory Visit | Attending: Physician Assistant | Admitting: Physician Assistant

## 2023-01-11 DIAGNOSIS — F17200 Nicotine dependence, unspecified, uncomplicated: Secondary | ICD-10-CM

## 2023-02-05 ENCOUNTER — Encounter: Payer: Self-pay | Admitting: Physician Assistant

## 2023-02-09 ENCOUNTER — Other Ambulatory Visit: Payer: BC Managed Care – PPO

## 2023-02-17 ENCOUNTER — Ambulatory Visit
Admission: RE | Admit: 2023-02-17 | Discharge: 2023-02-17 | Disposition: A | Discharge status: Home / Self Care | Payer: BC Managed Care – PPO | Source: Ambulatory Visit | Attending: Physician Assistant | Admitting: Physician Assistant

## 2023-02-17 DIAGNOSIS — F17200 Nicotine dependence, unspecified, uncomplicated: Secondary | ICD-10-CM

## 2023-02-17 DIAGNOSIS — F17210 Nicotine dependence, cigarettes, uncomplicated: Secondary | ICD-10-CM | POA: Diagnosis not present

## 2023-06-03 DIAGNOSIS — Z13220 Encounter for screening for lipoid disorders: Secondary | ICD-10-CM | POA: Diagnosis not present

## 2023-06-03 DIAGNOSIS — Z5181 Encounter for therapeutic drug level monitoring: Secondary | ICD-10-CM | POA: Diagnosis not present

## 2023-06-03 DIAGNOSIS — F17200 Nicotine dependence, unspecified, uncomplicated: Secondary | ICD-10-CM | POA: Diagnosis not present

## 2023-06-03 DIAGNOSIS — I10 Essential (primary) hypertension: Secondary | ICD-10-CM | POA: Diagnosis not present

## 2023-06-03 DIAGNOSIS — Z0000 Encounter for general adult medical examination without abnormal findings: Secondary | ICD-10-CM | POA: Diagnosis not present

## 2023-06-03 DIAGNOSIS — Z125 Encounter for screening for malignant neoplasm of prostate: Secondary | ICD-10-CM | POA: Diagnosis not present

## 2023-06-21 DIAGNOSIS — K5730 Diverticulosis of large intestine without perforation or abscess without bleeding: Secondary | ICD-10-CM | POA: Diagnosis not present

## 2023-06-21 DIAGNOSIS — K635 Polyp of colon: Secondary | ICD-10-CM | POA: Diagnosis not present

## 2023-06-21 DIAGNOSIS — D128 Benign neoplasm of rectum: Secondary | ICD-10-CM | POA: Diagnosis not present

## 2023-06-21 DIAGNOSIS — Z1211 Encounter for screening for malignant neoplasm of colon: Secondary | ICD-10-CM | POA: Diagnosis not present

## 2023-06-21 DIAGNOSIS — K648 Other hemorrhoids: Secondary | ICD-10-CM | POA: Diagnosis not present

## 2023-12-29 ENCOUNTER — Other Ambulatory Visit: Payer: Self-pay | Admitting: Physician Assistant

## 2023-12-29 DIAGNOSIS — R7303 Prediabetes: Secondary | ICD-10-CM | POA: Diagnosis not present

## 2023-12-29 DIAGNOSIS — F17200 Nicotine dependence, unspecified, uncomplicated: Secondary | ICD-10-CM

## 2023-12-29 DIAGNOSIS — R0683 Snoring: Secondary | ICD-10-CM | POA: Diagnosis not present

## 2023-12-29 DIAGNOSIS — I10 Essential (primary) hypertension: Secondary | ICD-10-CM | POA: Diagnosis not present

## 2024-01-18 DIAGNOSIS — R0683 Snoring: Secondary | ICD-10-CM | POA: Diagnosis not present

## 2024-01-18 DIAGNOSIS — G478 Other sleep disorders: Secondary | ICD-10-CM | POA: Diagnosis not present

## 2024-01-18 DIAGNOSIS — M270 Developmental disorders of jaws: Secondary | ICD-10-CM | POA: Diagnosis not present

## 2024-01-18 DIAGNOSIS — R0681 Apnea, not elsewhere classified: Secondary | ICD-10-CM | POA: Diagnosis not present

## 2024-02-16 ENCOUNTER — Encounter: Payer: Self-pay | Admitting: Physician Assistant

## 2024-02-18 ENCOUNTER — Ambulatory Visit
Admission: RE | Admit: 2024-02-18 | Discharge: 2024-02-18 | Disposition: A | Discharge status: Home / Self Care | Source: Ambulatory Visit | Attending: Physician Assistant | Admitting: Physician Assistant

## 2024-02-18 DIAGNOSIS — F17200 Nicotine dependence, unspecified, uncomplicated: Secondary | ICD-10-CM

## 2024-02-18 DIAGNOSIS — F17210 Nicotine dependence, cigarettes, uncomplicated: Secondary | ICD-10-CM | POA: Diagnosis not present

## 2024-02-28 DIAGNOSIS — G4733 Obstructive sleep apnea (adult) (pediatric): Secondary | ICD-10-CM | POA: Diagnosis not present

## 2024-03-08 DIAGNOSIS — G4733 Obstructive sleep apnea (adult) (pediatric): Secondary | ICD-10-CM | POA: Diagnosis not present

## 2024-04-26 ENCOUNTER — Other Ambulatory Visit: Payer: Self-pay

## 2024-04-26 ENCOUNTER — Emergency Department (HOSPITAL_BASED_OUTPATIENT_CLINIC_OR_DEPARTMENT_OTHER)
Admission: EM | Admit: 2024-04-26 | Discharge: 2024-04-27 | Disposition: A | Discharge status: Home / Self Care | Source: Ambulatory Visit | Attending: Emergency Medicine | Admitting: Emergency Medicine

## 2024-04-26 ENCOUNTER — Encounter (HOSPITAL_BASED_OUTPATIENT_CLINIC_OR_DEPARTMENT_OTHER): Payer: Self-pay | Admitting: Emergency Medicine

## 2024-04-26 ENCOUNTER — Emergency Department (HOSPITAL_BASED_OUTPATIENT_CLINIC_OR_DEPARTMENT_OTHER): Admitting: Radiology

## 2024-04-26 DIAGNOSIS — R000 Tachycardia, unspecified: Secondary | ICD-10-CM | POA: Diagnosis not present

## 2024-04-26 DIAGNOSIS — J439 Emphysema, unspecified: Secondary | ICD-10-CM | POA: Diagnosis not present

## 2024-04-26 DIAGNOSIS — G4733 Obstructive sleep apnea (adult) (pediatric): Secondary | ICD-10-CM | POA: Diagnosis not present

## 2024-04-26 DIAGNOSIS — R0689 Other abnormalities of breathing: Secondary | ICD-10-CM | POA: Insufficient documentation

## 2024-04-26 DIAGNOSIS — I4891 Unspecified atrial fibrillation: Secondary | ICD-10-CM | POA: Diagnosis not present

## 2024-04-26 DIAGNOSIS — R5383 Other fatigue: Secondary | ICD-10-CM

## 2024-04-26 DIAGNOSIS — G4730 Sleep apnea, unspecified: Secondary | ICD-10-CM | POA: Diagnosis not present

## 2024-04-26 HISTORY — DX: Sleep apnea, unspecified: G47.30

## 2024-04-26 LAB — BASIC METABOLIC PANEL WITH GFR
Anion gap: 15 (ref 5–15)
BUN: 19 mg/dL (ref 8–23)
CO2: 23 mmol/L (ref 22–32)
Calcium: 9.9 mg/dL (ref 8.9–10.3)
Chloride: 103 mmol/L (ref 98–111)
Creatinine, Ser: 1.07 mg/dL (ref 0.61–1.24)
GFR, Estimated: >60 mL/min (ref >60)
Glucose, Bld: 140 mg/dL — ABNORMAL HIGH (ref 70–99)
Potassium: 3.8 mmol/L (ref 3.5–5.1)
Sodium: 141 mmol/L (ref 135–145)

## 2024-04-26 LAB — PRO BRAIN NATRIURETIC PEPTIDE: Pro Brain Natriuretic Peptide: 823 pg/mL — ABNORMAL HIGH (ref <300.0)

## 2024-04-26 LAB — TSH: TSH: 1.41 u[IU]/mL (ref 0.350–4.500)

## 2024-04-26 LAB — CBC
HCT: 47.6 % (ref 39.0–52.0)
Hemoglobin: 16.3 g/dL (ref 13.0–17.0)
MCH: 30.6 pg (ref 26.0–34.0)
MCHC: 34.2 g/dL (ref 30.0–36.0)
MCV: 89.3 fL (ref 80.0–100.0)
Platelets: 248 K/uL (ref 150–400)
RBC: 5.33 MIL/uL (ref 4.22–5.81)
RDW: 14 % (ref 11.5–15.5)
WBC: 7.9 K/uL (ref 4.0–10.5)
nRBC: 0 % (ref 0.0–0.2)

## 2024-04-26 LAB — MAGNESIUM: Magnesium: 2.1 mg/dL (ref 1.7–2.4)

## 2024-04-26 LAB — TROPONIN T, HIGH SENSITIVITY
Troponin T High Sensitivity: <15 ng/L (ref 0–19)
Troponin T High Sensitivity: <15 ng/L (ref 0–19)

## 2024-04-26 MED ORDER — DIGOXIN 125 MCG PO TABS
0.2500 mg | ORAL_TABLET | Freq: Once | ORAL | Status: AC
Start: 2024-04-26 — End: 2024-04-26
  Administered 2024-04-26: 0.25 mg via ORAL
  Filled 2024-04-26: qty 2

## 2024-04-26 NOTE — ED Provider Notes (Signed)
 [place] [facility]PARTMENT AT [facility] Provider Note   CSN: [id number] Arrival date & time: 04/26/24  1911     Patient presents with: Tachycardia   Brian Bennett is a 63 y.o. male.  {Add pertinent medical, surgical, social history, OB history to HPI:32947} HPI     Prior to Admission medications   Medication Sig Start Date End Date Taking? Authorizing Provider  valsartan -hydrochlorothiazide  (DIOVAN -HCT) 160-12.5 MG tablet Take 1 tablet by mouth daily. 03/09/18   [provider]    Allergies: Patient has no known allergies.    Review of Systems  Updated Vital Signs BP 127/76   Pulse (!) 118   Temp 99 F (37.2 C) (Oral)   Resp 17   SpO2 97%   Physical Exam  (all labs ordered are listed, but only abnormal results are displayed) Labs Reviewed  BASIC METABOLIC PANEL WITH GFR - Abnormal; Notable for the following components:      Result Value   Glucose, Bld 140 (*)    All other components within normal limits  CBC  TROPONIN T, HIGH SENSITIVITY  TROPONIN T, HIGH SENSITIVITY    EKG: EKG Interpretation Date/Time:  Wednesday April 26 2024 19:23:49 EDT Ventricular Rate:  107 PR Interval:    QRS Duration:  88 QT Interval:  330 QTC Calculation: 440 R Axis:   54  Text Interpretation: Atrial fibrillation with rapid ventricular response ST & T wave abnormality, consider inferior ischemia Abnormal ECG No previous ECGs available no prior ECG for comparison No sTEMI Confirmed by Ginger Barefoot ([id number]) on 04/26/2024 10:21:39 PM  Radiology: DG Chest 2 View Result Date: 04/26/2024 CLINICAL DATA:  Tachycardia, sleep apnea.  No other complaints. EXAM: CHEST - 2 VIEW COMPARISON:  PA Lat chest 09/24/2017. Chest CT without contrast 02/18/2024. FINDINGS: The heart size and mediastinal contours are within normal limits. Both lungs are slightly emphysematous but clear apart from calcified pleural plaquing anterior left mid chest. The visualized skeletal  structures are intact, with multilevel thoracic spine bridging enthesopathy. IMPRESSION: 1. No evidence of acute chest disease. 2. Calcified pleural plaquing anterior left mid chest. 3. Emphysema. Electronically Signed   By: Francis Quam M.D.   On: 04/26/2024 20:15    {Document cardiac monitor, telemetry assessment procedure when appropriate:32947} Procedures   Medications Ordered in the ED - No data to display    {Click here for ABCD2, HEART and other calculators REFRESH Note before signing:1}                              Medical Decision Making Amount and/or Complexity of Data Reviewed Labs: ordered. Radiology: ordered.    Brian Bennett is a 63 y.o. male with no significant past medical history who presents from a sleep evaluation for tachycardia fatigue and suspected arrhythmia.  According to patient, he has snored for his whole life and was getting evaluated for possible sleep apnea and was told he had tachycardia and needs to come in for evaluation.  Patient says that for quite some time he has had fatigue but otherwise he denies any chest pain shortness of breath or palpitations.  He does have cramps in his legs frequently per family but he denies any chest pain.  His heart rate was found to be into the 130s and 140s and appeared to show A-fib with RVR.  He has no personal or family history of arrhythmia or A-fib that he knows of.  He  denies any fevers, chills, congestion, or cough.  He is otherwise asymptomatic aside from some mild fatigue.  He reports no leg pain or leg swelling but occasionally has cramping.  He denies any recent medication use or drug use.  He denies any stimulant or significant caffeine use.  Denies any history of thyroid disease.  Denies other complaints but was told he has to come in for this arrhythmia.  On exam, lungs clear.  Chest nontender.  Abdomen nontender.  No murmur.  No focal neurologic deficits.  No carotid bruit.  Patient resting and otherwise  well-appearing.  No leg tenderness or leg swelling.  Good pulses in extremities.  EKG on arrival does show A-fib with RVR with heart rates going into the 130s.  On reassessment heart rate is now between 100 and 120 but still shows atrial fibrillation.  He still has no palpitations but just feels tired.  Clinically I suspect he has been in A-fib for quite some time and this may have been causing some of his fatigue.  We will get screening labs to look for electrolyte disturbance given his leg cramping.  Will call cardiology given this likely new A-fib without a clear onset but with some symptoms such as fatigue.  Anticipate follow-up after the lab evaluation and discussion with cardiology as anticipate he made admission for echo and further A-fib workup and management.  11:14 PM Blood pressure is now in the 90s and heart rate is still about 120.  I initially spoke to a cardiologist to inform me that a different cardiology team member should be the one called so we will try to call the other cardiologist but she felt that given the soft pressures and still fast heart rate, he would benefit from medicine admission with cardiology consulting.  Will call the other cardiologist to discuss admission.   {Document critical care time when appropriate  Document review of labs and clinical decision tools ie CHADS2VASC2, etc  Document your independent review of radiology images and any outside records  Document your discussion with family members, caretakers and with consultants  Document social determinants of health affecting pt's care  Document your decision making why or why not admission, treatments were needed:32947:::1}   Final diagnoses:  None    ED Discharge Orders     None

## 2024-04-26 NOTE — ED Notes (Signed)
 Pt here today after visit with pulmonary for his sleep apnea New onset Atrial fib.  NAD or pain

## 2024-04-26 NOTE — ED Triage Notes (Signed)
 Seen in pulm office for sleep apnea eval. Was told to go to ed for tachycardia. Denies c/o

## 2024-04-27 LAB — CBC
HCT: 47.7 % (ref 39.0–52.0)
Hemoglobin: 16 g/dL (ref 13.0–17.0)
MCH: 30.6 pg (ref 26.0–34.0)
MCHC: 33.5 g/dL (ref 30.0–36.0)
MCV: 91.2 fL (ref 80.0–100.0)
Platelets: 225 K/uL (ref 150–400)
RBC: 5.23 MIL/uL (ref 4.22–5.81)
RDW: 14 % (ref 11.5–15.5)
WBC: 6.7 K/uL (ref 4.0–10.5)
nRBC: 0 % (ref 0.0–0.2)

## 2024-04-27 LAB — HEPARIN LEVEL (UNFRACTIONATED): Heparin Unfractionated: 0.52 [IU]/mL (ref 0.30–0.70)

## 2024-04-27 MED ORDER — METOPROLOL TARTRATE 5 MG/5ML IV SOLN
5.0000 mg | Freq: Once | INTRAVENOUS | Status: AC
Start: 2024-04-27 — End: 2024-04-27
  Administered 2024-04-27: 5 mg via INTRAVENOUS
  Filled 2024-04-27: qty 5

## 2024-04-27 MED ORDER — APIXABAN 2.5 MG PO TABS
5.0000 mg | ORAL_TABLET | Freq: Two times a day (BID) | ORAL | Status: DC
  Administered 2024-04-27: 5 mg via ORAL
  Filled 2024-04-27: qty 2

## 2024-04-27 MED ORDER — VALSARTAN-HYDROCHLOROTHIAZIDE 320-12.5 MG PO TABS: 1.0000 | ORAL_TABLET | Freq: Every day | ORAL | Status: DC

## 2024-04-27 MED ORDER — METOPROLOL TARTRATE 25 MG PO TABS
25.0000 mg | ORAL_TABLET | Freq: Two times a day (BID) | ORAL | Status: DC
Start: 2024-04-27 — End: 2024-04-27
  Administered 2024-04-27: 25 mg via ORAL
  Filled 2024-04-27: qty 1

## 2024-04-27 MED ORDER — DILTIAZEM HCL ER COATED BEADS 120 MG PO CP24
120.0000 mg | ORAL_CAPSULE | Freq: Once | ORAL | Status: AC
  Administered 2024-04-27: 120 mg via ORAL
  Filled 2024-04-27: qty 1

## 2024-04-27 MED ORDER — DILTIAZEM HCL ER COATED BEADS 180 MG PO CP24
180.0000 mg | ORAL_CAPSULE | Freq: Every day | ORAL | 6 refills | Status: AC
Start: 2024-04-27 — End: 2025-04-27

## 2024-04-27 MED ORDER — HEPARIN (PORCINE) 25000 UT/250ML-% IV SOLN
1600.0000 [IU]/h | INTRAVENOUS | Status: DC
Start: 2024-04-27 — End: 2024-04-27
  Administered 2024-04-27: 1600 [IU]/h via INTRAVENOUS
  Filled 2024-04-27: qty 250

## 2024-04-27 MED ORDER — DIGOXIN 125 MCG PO TABS
0.2500 mg | ORAL_TABLET | Freq: Once | ORAL | Status: AC
  Administered 2024-04-27: 0.25 mg via ORAL
  Filled 2024-04-27: qty 2

## 2024-04-27 MED ORDER — APIXABAN 5 MG PO TABS: 5.0000 mg | ORAL_TABLET | Freq: Two times a day (BID) | ORAL | 6 refills | Status: AC

## 2024-04-27 MED ORDER — HYDROCHLOROTHIAZIDE 25 MG PO TABS
12.5000 mg | ORAL_TABLET | Freq: Every day | ORAL | Status: DC
  Administered 2024-04-27: 12.5 mg via ORAL
  Filled 2024-04-27: qty 1

## 2024-04-27 MED ORDER — HEPARIN BOLUS VIA INFUSION
4000.0000 [IU] | Freq: Once | INTRAVENOUS | Status: AC
  Administered 2024-04-27: 4000 [IU] via INTRAVENOUS

## 2024-04-27 MED ORDER — METOPROLOL TARTRATE 25 MG PO TABS: 25.0000 mg | ORAL_TABLET | Freq: Two times a day (BID) | ORAL | 3 refills | Status: AC

## 2024-04-27 MED ORDER — IRBESARTAN 150 MG PO TABS
300.0000 mg | ORAL_TABLET | Freq: Every day | ORAL | Status: DC
  Administered 2024-04-27: 300 mg via ORAL
  Filled 2024-04-27: qty 2

## 2024-04-27 NOTE — Discharge Instructions (Signed)

## 2024-04-27 NOTE — ED Notes (Signed)
 New IV placed when obtaining CBC and Heparin  value. Patient states he feels well this morning. Patient and family updated that no inpatient bed is assigned to the patient currently. Patient provided with ice water per patient request. Call bell within reach. Patient states no other needs at this time.

## 2024-04-27 NOTE — H&P (Signed)
 Referring Physician: Lonni PARAS Tegeler, MD  Brian Bennett is an 63 y.o. male.                       Chief Complaint: Tachycardia/A. Fib.  HPI: 63 years old male with PMH of HTN, obesity has atrial fibrillation found on visit to pulmonary doctor for sleep apnea evaluation and was sent to ED for further evaluation where he was in a. Fib. with RVR. Patient denies chest pain but has some fatigue. CXR positive for pleural plaque and emphysema.   Past Medical History:  Diagnosis Date   Sleep apnea       History reviewed. No pertinent surgical history.  History reviewed. No pertinent family history. Social History:  reports that he has never smoked. He has never used smokeless tobacco. No history on file for alcohol use and drug use.  Allergies: No Known Allergies  (Not in a hospital admission)   Results for orders placed or performed during the hospital encounter of 04/26/24 (from the past 48 hours)  Basic metabolic panel     Status: Abnormal   Collection Time: 04/26/24  7:24 PM  Result Value Ref Range   Sodium 141 135 - 145 mmol/L   Potassium 3.8 3.5 - 5.1 mmol/L   Chloride 103 98 - 111 mmol/L   CO2 23 22 - 32 mmol/L   Glucose, Bld 140 (H) 70 - 99 mg/dL    Comment: Glucose reference range applies only to samples taken after fasting for at least 8 hours.   BUN 19 8 - 23 mg/dL   Creatinine, Ser 8.92 0.61 - 1.24 mg/dL   Calcium 9.9 8.9 - 89.6 mg/dL   GFR, Estimated >39 >39 mL/min    Comment: (NOTE) Calculated using the CKD-EPI Creatinine Equation (2021)    Anion gap 15 5 - 15    Comment: Performed at [facility], [address], [place], [place] [address]  CBC     Status: None   Collection Time: 04/26/24  7:24 PM  Result Value Ref Range   WBC 7.9 4.0 - 10.5 K/uL   RBC 5.33 4.22 - 5.81 MIL/uL   Hemoglobin 16.3 13.0 - 17.0 g/dL   HCT 52.3 60.9 - 47.9 %   MCV 89.3 80.0 - 100.0 fL   MCH 30.6 26.0 - 34.0 pg   MCHC 34.2 30.0 - 36.0 g/dL   RDW 85.9  88.4 - 84.4 %   Platelets 248 150 - 400 K/uL   nRBC 0.0 0.0 - 0.2 %    Comment: Performed at [facility], [address], [place], [place] [address]  Troponin T, High Sensitivity     Status: None   Collection Time: 04/26/24  7:24 PM  Result Value Ref Range   Troponin T High Sensitivity <15 0 - 19 ng/L    Comment: (NOTE) Biotin concentrations > 1000 ng/mL falsely decrease TnT results.  Serial cardiac troponin measurements are suggested.  Refer to the Links section for chest pain algorithms and additional  guidance. Performed at [facility], [address] [place], [place] [address]   Magnesium     Status: None   Collection Time: 04/26/24  7:24 PM  Result Value Ref Range   Magnesium 2.1 1.7 - 2.4 mg/dL    Comment: Performed at [facility], [address], [place], [place] [address]  Pro Brain natriuretic peptide     Status: Abnormal   Collection Time: 04/26/24  7:24 PM  Result Value Ref Range   Pro Brain Natriuretic Peptide 823.0 (H) <300.0 pg/mL    Comment: (NOTE) Age Group        Cut-Points    Interpretation  < 50 years     450 pg/mL       NT-proBNP > 450 pg/mL indicates                                ADHF is likely              50 to 75 years  900 pg/mL      NT-proBNP > 900 pg/mL indicates          ADHF is likely  > 75 years      1800 pg/mL     NT-proBNP > 1800 pg/mL indicates          ADHF is likely                           All ages    Results between       Indeterminate. Further clinical             300 and the cut-   information is needed to determine            point for age group   if ADHF is present.                                                             Elecsys proBNP II/ Elecsys proBNP II STAT           Cut-Point                       Interpretation  300 pg/mL                    NT-proBNP <300pg/mL indicates                             ADHF is not likely  Performed at [facility], [address], [place], [place] [address]   TSH     Status: None   Collection Time: 04/26/24  7:29 PM  Result Value Ref Range   TSH 1.410 0.350 - 4.500 uIU/mL    Comment: Performed at [facility], [address], [place], [place] [address]  Troponin T, High Sensitivity     Status: None   Collection Time: 04/26/24 11:24 PM  Result Value Ref Range   Troponin T High Sensitivity <15 0 - 19 ng/L    Comment: (NOTE) Biotin concentrations > 1000 ng/mL falsely decrease TnT results.  Serial cardiac troponin measurements are suggested.  Refer to the Links section for chest pain algorithms and additional  guidance. Performed at [facility], [address], [place], [place] [address]   CBC     Status: None   Collection Time: 04/27/24  8:18 AM  Result Value Ref Range   WBC 6.7 4.0 - 10.5 K/uL   RBC 5.23 4.22 - 5.81 MIL/uL   Hemoglobin 16.0 13.0 - 17.0 g/dL   HCT 52.2 60.9 - 47.9 %  MCV 91.2 80.0 - 100.0 fL   MCH 30.6 26.0 - 34.0 pg   MCHC 33.5 30.0 - 36.0 g/dL   RDW 85.9 88.4 - 84.4 %   Platelets 225 150 - 400 K/uL   nRBC 0.0 0.0 - 0.2 %    Comment: Performed at [facility], [address] [place], [place] [address]   DG Chest 2 View Result Date: 04/26/2024 CLINICAL DATA:  Tachycardia, sleep apnea.  No other complaints. EXAM: CHEST - 2 VIEW COMPARISON:  PA Lat chest 09/24/2017. Chest CT without contrast 02/18/2024. FINDINGS: The heart size and mediastinal contours are within normal limits. Both lungs are slightly emphysematous but clear apart from calcified pleural plaquing anterior left mid chest. The visualized skeletal structures are intact, with multilevel thoracic spine bridging enthesopathy. IMPRESSION: 1. No evidence of acute chest disease. 2. Calcified pleural plaquing anterior left mid chest. 3. Emphysema. Electronically Signed   By: Francis Quam M.D.   On: 04/26/2024 20:15    Review Of  Systems Constitutional: No fever, chills, weight loss or gain. Eyes: No vision change, wears glasses. No discharge or pain. Ears: No hearing loss, No tinnitus. Respiratory: No asthma, Positive emphysema, pneumonias. No shortness of breath. No hemoptysis. Cardiovascular: No chest pain, palpitation, leg edema. Gastrointestinal: No nausea, vomiting, diarrhea, constipation. No GI bleed. No hepatitis. Genitourinary: No dysuria, hematuria, kidney stone. No incontinance. Neurological: No headache, stroke, seizures.  Psychiatry: No psych facility admission for anxiety, depression, suicide. No detox. Skin: No rash. Musculoskeletal: No joint pain, fibromyalgia. No neck pain, back pain. Lymphadenopathy: No lymphadenopathy. Hematology: No anemia or easy bruising.   Blood pressure (!) 125/92, pulse (!) 114, temperature 98.7 F (37.1 C), temperature source Oral, resp. rate 18, height 6' 5 (1.956 m), weight 105.2 kg, SpO2 98%. Body mass index is 27.51 kg/m. General appearance: alert, cooperative, appears stated age and no distress Head: Normocephalic, atraumatic. Eyes: Brown eyes, pink conjunctiva, corneas clear. Neck: No adenopathy, no carotid bruit, no JVD, supple, symmetrical, trachea midline and thyroid not enlarged. Resp: Clear to auscultation bilaterally. Cardio: Irregular rate and rhythm, S1, S2 normal, II/VI systolic murmur, no click, rub or gallop GI: Soft, non-tender; bowel sounds normal; no organomegaly. Extremities: No edema, cyanosis or clubbing. Skin: Warm and dry.  Neurologic: Alert and oriented X 3, normal strength. Normal coordination.  Assessment/Plan Atrial fibrillation with RVR HTN Obesity  Plan: Add metoprolol  and diltiazem . Add Eliquis . Discussed 3-4 weeks of anticoagulation then cardioversion if needed or TEE now and cardioversion. Patient willing to try 3-4 weeks of anticoagulation before and after external cardioversion. Also discussed EP study for ablation. F/U in  1 week.  Time spent: Review of old records, Lab, x-rays, EKG, other cardiac tests, examination, discussion with patient/Nurse/Doctor over 70 minutes.  Salena GORMAN Negri, MD  04/27/2024, 9:01 AM

## 2024-04-27 NOTE — ED Notes (Signed)
 Discharge instructions reviewed with patient. Patient instructed to check his BP at home several times a day due to new BP medications being added to his medication list. Patient educated on risks of bleeding and falls while being on blood thinners and told to come directly to ER with any complications. Patient provided with 30 day free trial Eliquis  coupon. Patient states no questions at this time.

## 2024-04-27 NOTE — Progress Notes (Signed)
 PHARMACY - ANTICOAGULATION CONSULT NOTE  Pharmacy Consult for heparin  Indication: atrial fibrillation  No Known Allergies  Patient Measurements: Height: 6' 5 (195.6 cm) Weight: 105.2 kg (232 lb) IBW/kg (Calculated) : 89.1 HEPARIN  DW (KG): 105.2  Vital Signs: Temp: 99 F (37.2 C) (09/17 1930) Temp Source: Oral (09/17 1930) BP: 126/83 (09/17 2330) Pulse Rate: 110 (09/17 2336)  Labs: Recent Labs    04/26/24 1924  HGB 16.3  HCT 47.6  PLT 248  CREATININE 1.07    Estimated Creatinine Clearance: 89.1 mL/min (by C-G formula based on SCr of 1.07 mg/dL).   Medical History: Past Medical History:  Diagnosis Date   Sleep apnea     Medications:  (Not in a hospital admission)  Scheduled:   heparin   4,000 Units Intravenous Once   Infusions:   heparin       Assessment: Pt presented with new onset AF. Heparin  ordered for anticoagulation. Cbc wnl. No anticoag PTA.  Goal of Therapy:  Heparin  level 0.3-0.7 units/ml Monitor platelets by anticoagulation protocol: Yes   Plan:  Heparin  bolus 4000 units x1 Heparin  infusion 1600 units/hr Check 6 hr HL Daily HL and CBC  Sergio Batch, PharmD, BCIDP, AAHIVP, CPP Infectious Disease Pharmacist 04/27/2024 12:13 AM

## 2024-04-27 NOTE — Discharge Summary (Signed)
 Physician Discharge Summary  Patient ID: Brian Bennett MRN: [id number] DOB/AGE: [birth year] 64 y.o.  Admit date: 04/26/2024 Discharge date: 04/27/2024  Admission Diagnoses: Atrial fibrillation with RVR HTN Obesity  Discharge Diagnoses:  Principal Problem:   A-fib (HCC), CHADS2VA2Sc score of 2 Active problems: HTN Obesity  Discharged Condition: good  Hospital Course: 63 years old male with PMH of HTN, obesity has atrial fibrillation found on visit to pulmonary doctor for sleep apnea evaluation and was sent to ED for further evaluation where he was in a. Fib. with RVR. Patient denies chest pain but has some fatigue. CXR positive for pleural plaque and emphysema.  His TSH was normal. Troponin T was negative x 2. CBC and BMET were unremarkable. He responded to Metoprolol  IV then PO and diltiazem  PO with his usual BP medications with HR in 70-100's/min. He understood medication use, weight loss and future external cardioversion  and pulmonary vein area ablation for a. Fib. as needed. He will have OP echocardiogram. He will see me in 1 week.  Consults: cardiology  Significant Diagnostic Studies: labs: Normal CBC, BMET, Troponin T, TSH.  EKG: Atrial fib with RVR.  CXR: Pleural plaques and emphysema  Treatments: cardiac meds: Eliquis , metoprolol , diltiazem , and Valsartan -HCTZ  Discharge Exam: Blood pressure 121/84, pulse 64, temperature 98.7 F (37.1 C), temperature source Oral, resp. rate 12, height 6' 5 (1.956 m), weight 105.2 kg, SpO2 95%. General appearance: alert, cooperative and appears stated age. Head: Normocephalic, atraumatic. Eyes: Brown eyes, pink conjunctiva, corneas clear.  Neck: No adenopathy, no carotid bruit, no JVD, supple, symmetrical, trachea midline and thyroid not enlarged. Resp: Clear to auscultation bilaterally. Cardio: Regular rate and rhythm, S1, S2 normal, II/VI systolic murmur, no click, rub or gallop. GI: Soft, non-tender; bowel sounds normal; no  organomegaly. Extremities: No edema, cyanosis or clubbing. Skin: Warm and dry.  Neurologic: Alert and oriented X 3, normal strength and tone. Normal coordination and gait.  Disposition: Discharge disposition: 01-Home or Self Care        Allergies as of 04/27/2024   No Known Allergies      Medication List     TAKE these medications    apixaban  5 MG Tabs tablet Commonly known as: ELIQUIS  Take 1 tablet (5 mg total) by mouth 2 (two) times daily.   diltiazem  180 MG 24 hr capsule Commonly known as: Cardizem  CD Take 1 capsule (180 mg total) by mouth daily.   metoprolol  tartrate 25 MG tablet Commonly known as: LOPRESSOR  Take 1 tablet (25 mg total) by mouth 2 (two) times daily.   valsartan -hydrochlorothiazide  320-12.5 MG tablet Commonly known as: DIOVAN -HCT Take 1 tablet by mouth daily.        Follow-up Information     Claudene Pacific, MD Follow up in 1 week(s).   Specialty: Cardiology Contact information: [address] [place] [address] [phone]                 Time spent: Review of old chart, current chart, lab, x-ray, cardiac tests and discussion with patient/Nurse/Doctor over 60 minutes.  Signed: Pacific GORMAN Claudene 04/27/2024, 10:00 AM

## 2024-04-27 NOTE — ED Notes (Signed)
 Report received from Samantha, [place]. Lab called to inform them of need to send heparin  level to main lab. Lab calling courier. Heparin  level to be collected once courier arrives.

## 2024-06-08 DIAGNOSIS — G4733 Obstructive sleep apnea (adult) (pediatric): Secondary | ICD-10-CM | POA: Diagnosis not present

## 2024-06-09 DIAGNOSIS — Z1331 Encounter for screening for depression: Secondary | ICD-10-CM | POA: Diagnosis not present

## 2024-06-09 DIAGNOSIS — Z0000 Encounter for general adult medical examination without abnormal findings: Secondary | ICD-10-CM | POA: Diagnosis not present

## 2024-06-09 DIAGNOSIS — I4891 Unspecified atrial fibrillation: Secondary | ICD-10-CM | POA: Diagnosis not present

## 2024-06-09 DIAGNOSIS — G4733 Obstructive sleep apnea (adult) (pediatric): Secondary | ICD-10-CM | POA: Diagnosis not present

## 2024-06-09 DIAGNOSIS — I10 Essential (primary) hypertension: Secondary | ICD-10-CM | POA: Diagnosis not present

## 2024-06-09 DIAGNOSIS — Z125 Encounter for screening for malignant neoplasm of prostate: Secondary | ICD-10-CM | POA: Diagnosis not present

## 2024-06-09 DIAGNOSIS — R7303 Prediabetes: Secondary | ICD-10-CM | POA: Diagnosis not present
